# Patient Record
Sex: Female | Born: 1955 | Race: White | Hispanic: No | Marital: Married | Smoking: Never smoker
Health system: Southern US, Community
[De-identification: ages and names within clinical notes are randomized; demographics above are authoritative.]

---

## 2003-09-03 ENCOUNTER — Ambulatory Visit (HOSPITAL_COMMUNITY): Admission: RE | Admit: 2003-09-03 | Discharge: 2003-09-03 | Payer: Self-pay | Admitting: Family Medicine

## 2003-10-22 ENCOUNTER — Other Ambulatory Visit: Admission: RE | Admit: 2003-10-22 | Discharge: 2003-10-22 | Payer: Self-pay | Admitting: Family Medicine

## 2008-04-09 ENCOUNTER — Other Ambulatory Visit: Admission: RE | Admit: 2008-04-09 | Discharge: 2008-04-09 | Payer: Self-pay | Admitting: Family Medicine

## 2009-04-10 ENCOUNTER — Other Ambulatory Visit: Admission: RE | Admit: 2009-04-10 | Discharge: 2009-04-10 | Payer: Self-pay | Admitting: Family Medicine

## 2010-10-02 ENCOUNTER — Encounter: Payer: Self-pay | Admitting: Family Medicine

## 2012-05-30 ENCOUNTER — Other Ambulatory Visit (HOSPITAL_COMMUNITY)
Admission: RE | Admit: 2012-05-30 | Discharge: 2012-05-30 | Disposition: A | Discharge status: Home / Self Care | Payer: BC Managed Care – PPO | Source: Ambulatory Visit | Attending: Family Medicine | Admitting: Family Medicine

## 2012-05-30 ENCOUNTER — Other Ambulatory Visit: Payer: Self-pay | Admitting: Family Medicine

## 2012-05-30 DIAGNOSIS — Z1151 Encounter for screening for human papillomavirus (HPV): Secondary | ICD-10-CM | POA: Insufficient documentation

## 2012-05-30 DIAGNOSIS — Z124 Encounter for screening for malignant neoplasm of cervix: Secondary | ICD-10-CM | POA: Insufficient documentation

## 2014-11-09 ENCOUNTER — Emergency Department (HOSPITAL_COMMUNITY): Admission: EM | Admit: 2014-11-09 | Discharge: 2014-11-09 | Discharge status: Absent without leave | Payer: Self-pay

## 2014-11-13 ENCOUNTER — Other Ambulatory Visit: Payer: Self-pay | Admitting: Family Medicine

## 2014-11-13 ENCOUNTER — Ambulatory Visit
Admission: RE | Admit: 2014-11-13 | Discharge: 2014-11-13 | Disposition: A | Discharge status: Home / Self Care | Payer: Self-pay | Source: Ambulatory Visit | Attending: Family Medicine | Admitting: Family Medicine

## 2014-11-13 DIAGNOSIS — M5431 Sciatica, right side: Secondary | ICD-10-CM

## 2014-11-13 DIAGNOSIS — M5416 Radiculopathy, lumbar region: Secondary | ICD-10-CM

## 2014-11-22 ENCOUNTER — Ambulatory Visit (INDEPENDENT_AMBULATORY_CARE_PROVIDER_SITE_OTHER): Payer: BC Managed Care – PPO | Admitting: Physician Assistant

## 2014-11-22 VITALS — BP 142/84 | HR 70 | Temp 97.9°F | Resp 18 | Ht 60.5 in | Wt 117.4 lb

## 2014-11-22 DIAGNOSIS — M5431 Sciatica, right side: Secondary | ICD-10-CM

## 2014-11-22 DIAGNOSIS — M5136 Other intervertebral disc degeneration, lumbar region: Secondary | ICD-10-CM | POA: Diagnosis not present

## 2014-11-22 MED ORDER — CYCLOBENZAPRINE HCL 5 MG PO TABS: 5.0000 mg | ORAL_TABLET | Freq: Three times a day (TID) | ORAL | Status: AC | PRN

## 2014-11-22 MED ORDER — HYDROCODONE-ACETAMINOPHEN 5-325 MG PO TABS: 1.0000 | ORAL_TABLET | Freq: Four times a day (QID) | ORAL | Status: AC | PRN

## 2014-11-22 NOTE — Progress Notes (Signed)
Subjective:    Patient ID: Chloe Griffin, female    DOB: 1956/04/07, 59 y.o.   MRN: [id number]  Chief Complaint  Patient presents with  . Flank Pain    radiates to leg right side, has been x-rayed, told she had degenerative discs, going on for 18 days, been taking prednisone and meloxicam, not alleviating pain   Patient Active Problem List   Diagnosis Date Noted  . Degenerative disc disease, lumbar 11/22/2014   Medications, allergies, past medical history, surgical history, family history, social history and problem list reviewed and updated.  HPI  59 yof with no significant pmh presents with ongoing right sciatica.   Sx started suddenly 18 days ago when they awoke her from sleep. Had a sharp right sided low back pain along with right buttock pain. This pain persisted so she went to pcp couple days later. Lumbar xr showed mild ddd L4-5. Started on meloxicam.   Three days later was having no relief with meloxicam so went to diff urgent care. Given pred taper. She took 60 mg daily for 3 days but stopped the med at that point as was not having relief.   Today states pain is severe. Is keeping her up at night. She is able to walk fine and get around during the day but when lies down at night has radiating pain down right leg. Radiation has slowly been increasing in distance and past few days has gone down right buttock, right hamstring, right calf and into right heel. Denies any bowel/bladder dysfx or perianal los. Denies fever, chills.   Review of Systems See HPI.     Objective:   Physical Exam  Constitutional: She is oriented to person, place, and time. She appears well-developed and well-nourished.  Non-toxic appearance. She does not have a sickly appearance. She does not appear ill. No distress.  BP 142/84 mmHg  Pulse 70  Temp(Src) 97.9 F (36.6 C) (Oral)  Resp 18  Ht 5' 0.5" (1.537 m)  Wt 117 lb 6.4 oz (53.252 kg)  BMI 22.54 kg/m2  SpO2 98%   Musculoskeletal:   Lumbar back: She exhibits decreased range of motion. She exhibits no tenderness, no bony tenderness and no spasm.  Decreased rom due to pain. No ttp over lumbar spine or paraspinals.   Neurological: She is alert and oriented to person, place, and time.  Positive SLR right leg at 45 degrees. Negative with left leg. Normal sensation down right leg. Normal strength testing at right knee, ankle.   Psychiatric: She has a normal mood and affect. Her speech is normal.      Assessment & Plan:   59 yof with no significant pmh presents with ongoing right sciatica.   Degenerative disc disease, lumbar - Plan: Ambulatory referral to Orthopedic Surgery, Ambulatory referral to Physical Therapy, MR Lumbar Spine Wo Contrast Sciatica, right - Plan: Ambulatory referral to Orthopedic Surgery, Ambulatory referral to Physical Therapy, MR Lumbar Spine Wo Contrast, HYDROcodone-acetaminophen (NORCO) 5-325 MG per tablet, cyclobenzaprine (FLEXERIL) 5 MG tablet --sciatica pain most likely from bulged or herniated lumbar disc --pt has had lumbar xr which showed ddd L4-5 --has tried meloxicam and prednisone without relief --pain is keeping her up at night --referrals to orthopedics and physical therapy --order sent for lumbar mri with plan of having pt follow with ortho with results to quicken the process in this pt with obvious sciatica pain --norco qhs for pain, flexeril prn, continue meloxicam prn --low back stretches given reviewed with pt, perform if  no pain --er with bowel/bladder dysfx, perinanal los  Donnajean Lopesodd M. Kyler Lerette, PA-C Physician Assistant-Certified [facility]  11/22/2014 10:46 AM

## 2014-11-22 NOTE — Patient Instructions (Signed)
I have referred you to ortho, to physical therapy, and to have an MRI done.  They will all be in contact with you individually to schedule these things.  In the meantime you can try some of these stretches very slowly and lightly. If they hurt you do not do them. For the pain, I recommend continuing the meloxicam. Please take the norco at night to help you sleep. The muscle relaxant may help as well, you can take this up to three times per day. Be aware these meds may make you sleepy. Use caution with driving.  Please go to the ED right away if you start having trouble controlling your bowel or bladder or have loss of sensation in the area while wiping.   Sciatica with Rehab The sciatic nerve runs from the back down the leg and is responsible for sensation and control of the muscles in the back (posterior) side of the thigh, lower leg, and foot. Sciatica is a condition that is characterized by inflammation of this nerve.  SYMPTOMS   Signs of nerve damage, including numbness and/or weakness along the posterior side of the lower extremity.  Pain in the back of the thigh that may also travel down the leg.  Pain that worsens when sitting for long periods of time.  Occasionally, pain in the back or buttock. CAUSES  Inflammation of the sciatic nerve is the cause of sciatica. The inflammation is due to something irritating the nerve. Common sources of irritation include:  Sitting for long periods of time.  Direct trauma to the nerve.  Arthritis of the spine.  Herniated or ruptured disk.  Slipping of the vertebrae (spondylolisthesis).  Pressure from soft tissues, such as muscles or ligament-like tissue (fascia). RISK INCREASES WITH:  Sports that place pressure or stress on the spine (football or weightlifting).  Poor strength and flexibility.  Failure to warm up properly before activity.  Family history of low back pain or disk disorders.  Previous back injury or surgery.  Poor  body mechanics, especially when lifting, or poor posture. PREVENTION   Warm up and stretch properly before activity.  Maintain physical fitness:  Strength, flexibility, and endurance.  Cardiovascular fitness.  Learn and use proper technique, especially with posture and lifting. When possible, have coach correct improper technique.  Avoid activities that place stress on the spine. PROGNOSIS If treated properly, then sciatica usually resolves within 6 weeks. However, occasionally surgery is necessary.  RELATED COMPLICATIONS   Permanent nerve damage, including pain, numbness, tingle, or weakness.  Chronic back pain.  Risks of surgery: infection, bleeding, nerve damage, or damage to surrounding tissues. TREATMENT Treatment initially involves resting from any activities that aggravate your symptoms. The use of ice and medication may help reduce pain and inflammation. The use of strengthening and stretching exercises may help reduce pain with activity. These exercises may be performed at home or with referral to a therapist. A therapist may recommend further treatments, such as transcutaneous electronic nerve stimulation (TENS) or ultrasound. Your caregiver may recommend corticosteroid injections to help reduce inflammation of the sciatic nerve. If symptoms persist despite non-surgical (conservative) treatment, then surgery may be recommended. MEDICATION  If pain medication is necessary, then nonsteroidal anti-inflammatory medications, such as aspirin and ibuprofen, or other minor pain relievers, such as acetaminophen, are often recommended.  Do not take pain medication for 7 days before surgery.  Prescription pain relievers may be given if deemed necessary by your caregiver. Use only as directed and only as much as  you need.  Ointments applied to the skin may be helpful.  Corticosteroid injections may be given by your caregiver. These injections should be reserved for the most serious  cases, because they may only be given a certain number of times. HEAT AND COLD  Cold treatment (icing) relieves pain and reduces inflammation. Cold treatment should be applied for 10 to 15 minutes every 2 to 3 hours for inflammation and pain and immediately after any activity that aggravates your symptoms. Use ice packs or massage the area with a piece of ice (ice massage).  Heat treatment may be used prior to performing the stretching and strengthening activities prescribed by your caregiver, physical therapist, or athletic trainer. Use a heat pack or soak the injury in warm water. SEEK MEDICAL CARE IF:  Treatment seems to offer no benefit, or the condition worsens.  Any medications produce adverse side effects. EXERCISES  RANGE OF MOTION (ROM) AND STRETCHING EXERCISES - Sciatica Most people with sciatic will find that their symptoms worsen with either excessive bending forward (flexion) or arching at the low back (extension). The exercises which will help resolve your symptoms will focus on the opposite motion. Your physician, physical therapist or athletic trainer will help you determine which exercises will be most helpful to resolve your low back pain. Do not complete any exercises without first consulting with your clinician. Discontinue any exercises which worsen your symptoms until you speak to your clinician. If you have pain, numbness or tingling which travels down into your buttocks, leg or foot, the goal of the therapy is for these symptoms to move closer to your back and eventually resolve. Occasionally, these leg symptoms will get better, but your low back pain may worsen; this is typically an indication of progress in your rehabilitation. Be certain to be very alert to any changes in your symptoms and the activities in which you participated in the 24 hours prior to the change. Sharing this information with your clinician will allow him/her to most efficiently treat your condition. These  exercises may help you when beginning to rehabilitate your injury. Your symptoms may resolve with or without further involvement from your physician, physical therapist or athletic trainer. While completing these exercises, remember:   Restoring tissue flexibility helps normal motion to return to the joints. This allows healthier, less painful movement and activity.  An effective stretch should be held for at least 30 seconds.  A stretch should never be painful. You should only feel a gentle lengthening or release in the stretched tissue. FLEXION RANGE OF MOTION AND STRETCHING EXERCISES: STRETCH - Flexion, Single Knee to Chest   Lie on a firm bed or floor with both legs extended in front of you.  Keeping one leg in contact with the floor, bring your opposite knee to your chest. Hold your leg in place by either grabbing behind your thigh or at your knee.  Pull until you feel a gentle stretch in your low back. Hold __________ seconds.  Slowly release your grasp and repeat the exercise with the opposite side. Repeat __________ times. Complete this exercise __________ times per day.  STRETCH - Flexion, Double Knee to Chest  Lie on a firm bed or floor with both legs extended in front of you.  Keeping one leg in contact with the floor, bring your opposite knee to your chest.  Tense your stomach muscles to support your back and then lift your other knee to your chest. Hold your legs in place by either grabbing  behind your thighs or at your knees.  Pull both knees toward your chest until you feel a gentle stretch in your low back. Hold __________ seconds.  Tense your stomach muscles and slowly return one leg at a time to the floor. Repeat __________ times. Complete this exercise __________ times per day.  STRETCH - Low Trunk Rotation   Lie on a firm bed or floor. Keeping your legs in front of you, bend your knees so they are both pointed toward the ceiling and your feet are flat on the  floor.  Extend your arms out to the side. This will stabilize your upper body by keeping your shoulders in contact with the floor.  Gently and slowly drop both knees together to one side until you feel a gentle stretch in your low back. Hold for __________ seconds.  Tense your stomach muscles to support your low back as you bring your knees back to the starting position. Repeat the exercise to the other side. Repeat __________ times. Complete this exercise __________ times per day  EXTENSION RANGE OF MOTION AND FLEXIBILITY EXERCISES: STRETCH - Extension, Prone on Elbows  Lie on your stomach on the floor, a bed will be too soft. Place your palms about shoulder width apart and at the height of your head.  Place your elbows under your shoulders. If this is too painful, stack pillows under your chest.  Allow your body to relax so that your hips drop lower and make contact more completely with the floor.  Hold this position for __________ seconds.  Slowly return to lying flat on the floor. Repeat __________ times. Complete this exercise __________ times per day.  RANGE OF MOTION - Extension, Prone Press Ups  Lie on your stomach on the floor, a bed will be too soft. Place your palms about shoulder width apart and at the height of your head.  Keeping your back as relaxed as possible, slowly straighten your elbows while keeping your hips on the floor. You may adjust the placement of your hands to maximize your comfort. As you gain motion, your hands will come more underneath your shoulders.  Hold this position __________ seconds.  Slowly return to lying flat on the floor. Repeat __________ times. Complete this exercise __________ times per day.  STRENGTHENING EXERCISES - Sciatica  These exercises may help you when beginning to rehabilitate your injury. These exercises should be done near your "sweet spot." This is the neutral, low-back arch, somewhere between fully rounded and fully arched,  that is your least painful position. When performed in this safe range of motion, these exercises can be used for people who have either a flexion or extension based injury. These exercises may resolve your symptoms with or without further involvement from your physician, physical therapist or athletic trainer. While completing these exercises, remember:   Muscles can gain both the endurance and the strength needed for everyday activities through controlled exercises.  Complete these exercises as instructed by your physician, physical therapist or athletic trainer. Progress with the resistance and repetition exercises only as your caregiver advises.  You may experience muscle soreness or fatigue, but the pain or discomfort you are trying to eliminate should never worsen during these exercises. If this pain does worsen, stop and make certain you are following the directions exactly. If the pain is still present after adjustments, discontinue the exercise until you can discuss the trouble with your clinician. STRENGTHENING - Deep Abdominals, Pelvic Tilt   Lie on a firm bed  or floor. Keeping your legs in front of you, bend your knees so they are both pointed toward the ceiling and your feet are flat on the floor.  Tense your lower abdominal muscles to press your low back into the floor. This motion will rotate your pelvis so that your tail bone is scooping upwards rather than pointing at your feet or into the floor.  With a gentle tension and even breathing, hold this position for __________ seconds. Repeat __________ times. Complete this exercise __________ times per day.  STRENGTHENING - Abdominals, Crunches   Lie on a firm bed or floor. Keeping your legs in front of you, bend your knees so they are both pointed toward the ceiling and your feet are flat on the floor. Cross your arms over your chest.  Slightly tip your chin down without bending your neck.  Tense your abdominals and slowly lift  your trunk high enough to just clear your shoulder blades. Lifting higher can put excessive stress on the low back and does not further strengthen your abdominal muscles.  Control your return to the starting position. Repeat __________ times. Complete this exercise __________ times per day.  STRENGTHENING - Quadruped, Opposite UE/LE Lift  Assume a hands and knees position on a firm surface. Keep your hands under your shoulders and your knees under your hips. You may place padding under your knees for comfort.  Find your neutral spine and gently tense your abdominal muscles so that you can maintain this position. Your shoulders and hips should form a rectangle that is parallel with the floor and is not twisted.  Keeping your trunk steady, lift your right hand no higher than your shoulder and then your left leg no higher than your hip. Make sure you are not holding your breath. Hold this position __________ seconds.  Continuing to keep your abdominal muscles tense and your back steady, slowly return to your starting position. Repeat with the opposite arm and leg. Repeat __________ times. Complete this exercise __________ times per day.  STRENGTHENING - Abdominals and Quadriceps, Straight Leg Raise   Lie on a firm bed or floor with both legs extended in front of you.  Keeping one leg in contact with the floor, bend the other knee so that your foot can rest flat on the floor.  Find your neutral spine, and tense your abdominal muscles to maintain your spinal position throughout the exercise.  Slowly lift your straight leg off the floor about 6 inches for a count of 15, making sure to not hold your breath.  Still keeping your neutral spine, slowly lower your leg all the way to the floor. Repeat this exercise with each leg __________ times. Complete this exercise __________ times per day. POSTURE AND BODY MECHANICS CONSIDERATIONS - Sciatica Keeping correct posture when sitting, standing or  completing your activities will reduce the stress put on different body tissues, allowing injured tissues a chance to heal and limiting painful experiences. The following are general guidelines for improved posture. Your physician or physical therapist will provide you with any instructions specific to your needs. While reading these guidelines, remember:  The exercises prescribed by your provider will help you have the flexibility and strength to maintain correct postures.  The correct posture provides the optimal environment for your joints to work. All of your joints have less wear and tear when properly supported by a spine with good posture. This means you will experience a healthier, less painful body.  Correct posture must be  practiced with all of your activities, especially prolonged sitting and standing. Correct posture is as important when doing repetitive low-stress activities (typing) as it is when doing a single heavy-load activity (lifting). RESTING POSITIONS Consider which positions are most painful for you when choosing a resting position. If you have pain with flexion-based activities (sitting, bending, stooping, squatting), choose a position that allows you to rest in a less flexed posture. You would want to avoid curling into a fetal position on your side. If your pain worsens with extension-based activities (prolonged standing, working overhead), avoid resting in an extended position such as sleeping on your stomach. Most people will find more comfort when they rest with their spine in a more neutral position, neither too rounded nor too arched. Lying on a non-sagging bed on your side with a pillow between your knees, or on your back with a pillow under your knees will often provide some relief. Keep in mind, being in any one position for a prolonged period of time, no matter how correct your posture, can still lead to stiffness. PROPER SITTING POSTURE In order to minimize stress and  discomfort on your spine, you must sit with correct posture Sitting with good posture should be effortless for a healthy body. Returning to good posture is a gradual process. Many people can work toward this most comfortably by using various supports until they have the flexibility and strength to maintain this posture on their own. When sitting with proper posture, your ears will fall over your shoulders and your shoulders will fall over your hips. You should use the back of the chair to support your upper back. Your low back will be in a neutral position, just slightly arched. You may place a small pillow or folded towel at the base of your low back for support.  When working at a desk, create an environment that supports good, upright posture. Without extra support, muscles fatigue and lead to excessive strain on joints and other tissues. Keep these recommendations in mind: CHAIR:   A chair should be able to slide under your desk when your back makes contact with the back of the chair. This allows you to work closely.  The chair's height should allow your eyes to be level with the upper part of your monitor and your hands to be slightly lower than your elbows. BODY POSITION  Your feet should make contact with the floor. If this is not possible, use a foot rest.  Keep your ears over your shoulders. This will reduce stress on your neck and low back. INCORRECT SITTING POSTURES   If you are feeling tired and unable to assume a healthy sitting posture, do not slouch or slump. This puts excessive strain on your back tissues, causing more damage and pain. Healthier options include:  Using more support, like a lumbar pillow.  Switching tasks to something that requires you to be upright or walking.  Talking a brief walk.  Lying down to rest in a neutral-spine position. PROLONGED STANDING WHILE SLIGHTLY LEANING FORWARD  When completing a task that requires you to lean forward while standing in one  place for a long time, place either foot up on a stationary 2-4 inch high object to help maintain the best posture. When both feet are on the ground, the low back tends to lose its slight inward curve. If this curve flattens (or becomes too large), then the back and your other joints will experience too much stress, fatigue more quickly and  can cause pain.  CORRECT STANDING POSTURES Proper standing posture should be assumed with all daily activities, even if they only take a few moments, like when brushing your teeth. As in sitting, your ears should fall over your shoulders and your shoulders should fall over your hips. You should keep a slight tension in your abdominal muscles to brace your spine. Your tailbone should point down to the ground, not behind your body, resulting in an over-extended swayback posture.  INCORRECT STANDING POSTURES  Common incorrect standing postures include a forward head, locked knees and/or an excessive swayback. WALKING Walk with an upright posture. Your ears, shoulders and hips should all line-up. PROLONGED ACTIVITY IN A FLEXED POSITION When completing a task that requires you to bend forward at your waist or lean over a low surface, try to find a way to stabilize 3 of 4 of your limbs. You can place a hand or elbow on your thigh or rest a knee on the surface you are reaching across. This will provide you more stability so that your muscles do not fatigue as quickly. By keeping your knees relaxed, or slightly bent, you will also reduce stress across your low back. CORRECT LIFTING TECHNIQUES DO :   Assume a wide stance. This will provide you more stability and the opportunity to get as close as possible to the object which you are lifting.  Tense your abdominals to brace your spine; then bend at the knees and hips. Keeping your back locked in a neutral-spine position, lift using your leg muscles. Lift with your legs, keeping your back straight.  Test the weight of  unknown objects before attempting to lift them.  Try to keep your elbows locked down at your sides in order get the best strength from your shoulders when carrying an object.  Always ask for help when lifting heavy or awkward objects. INCORRECT LIFTING TECHNIQUES DO NOT:   Lock your knees when lifting, even if it is a small object.  Bend and twist. Pivot at your feet or move your feet when needing to change directions.  Assume that you cannot safely pick up a paperclip without proper posture. Document Released: 08/29/2005 Document Revised: 01/13/2014 Document Reviewed: 12/11/2008 [facility] Patient Information 2015 [place]. This information is not intended to replace advice given to you by your health care provider. Make sure you discuss any questions you have with your health care provider.

## 2014-12-01 ENCOUNTER — Telehealth: Payer: Self-pay

## 2014-12-01 NOTE — Telephone Encounter (Signed)
Patient came in to drop [facility]. Patient states the form wasn't filled out correctly by the provider. A copy has been made and placed in [employer] box. Patient phone: [phone]

## 2014-12-01 NOTE — Telephone Encounter (Signed)
fyi todd

## 2014-12-03 NOTE — Telephone Encounter (Signed)
Called and left msg. Informed pt that since I also referred her to ortho for her low back at our appt that I will wait for them to see her in order to order the lumbar MRI if they feel she needs one. Instructed pt to call back with any questions.

## 2015-05-18 ENCOUNTER — Ambulatory Visit (INDEPENDENT_AMBULATORY_CARE_PROVIDER_SITE_OTHER): Payer: BC Managed Care – PPO | Admitting: Physician Assistant

## 2015-05-18 VITALS — BP 158/80 | HR 68 | Temp 97.5°F | Resp 16 | Ht 60.0 in | Wt 117.0 lb

## 2015-05-18 DIAGNOSIS — T304 Corrosion of unspecified body region, unspecified degree: Secondary | ICD-10-CM

## 2015-05-18 DIAGNOSIS — L0103 Bullous impetigo: Secondary | ICD-10-CM | POA: Diagnosis not present

## 2015-05-18 MED ORDER — MUPIROCIN 2 % EX OINT: 1.0000 "application " | TOPICAL_OINTMENT | Freq: Three times a day (TID) | CUTANEOUS | Status: AC

## 2015-05-18 MED ORDER — AMOXICILLIN-POT CLAVULANATE 875-125 MG PO TABS: 1.0000 | ORAL_TABLET | Freq: Two times a day (BID) | ORAL | Status: AC
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
## 2016-05-11 ENCOUNTER — Other Ambulatory Visit (HOSPITAL_COMMUNITY)
Admission: RE | Admit: 2016-05-11 | Discharge: 2016-05-11 | Disposition: A | Discharge status: Home / Self Care | Payer: BC Managed Care – PPO | Source: Ambulatory Visit | Attending: Family Medicine | Admitting: Family Medicine

## 2016-05-11 ENCOUNTER — Other Ambulatory Visit: Payer: Self-pay

## 2016-05-11 ENCOUNTER — Other Ambulatory Visit: Payer: Self-pay | Admitting: Family Medicine

## 2016-05-11 DIAGNOSIS — E2839 Other primary ovarian failure: Secondary | ICD-10-CM

## 2016-05-11 DIAGNOSIS — Z124 Encounter for screening for malignant neoplasm of cervix: Secondary | ICD-10-CM | POA: Diagnosis not present

## 2016-05-11 DIAGNOSIS — Z1231 Encounter for screening mammogram for malignant neoplasm of breast: Secondary | ICD-10-CM

## 2016-05-13 LAB — CYTOLOGY - PAP

## 2016-06-07 ENCOUNTER — Ambulatory Visit
Admission: RE | Admit: 2016-06-07 | Discharge: 2016-06-07 | Disposition: A | Discharge status: Home / Self Care | Payer: BC Managed Care – PPO | Source: Ambulatory Visit | Attending: Family Medicine | Admitting: Family Medicine

## 2016-06-07 ENCOUNTER — Other Ambulatory Visit: Payer: Self-pay | Admitting: Family Medicine

## 2016-06-07 DIAGNOSIS — Z1231 Encounter for screening mammogram for malignant neoplasm of breast: Secondary | ICD-10-CM

## 2016-06-07 DIAGNOSIS — E2839 Other primary ovarian failure: Secondary | ICD-10-CM

## 2019-04-17 ENCOUNTER — Other Ambulatory Visit: Payer: Self-pay | Admitting: Family Medicine

## 2019-04-17 DIAGNOSIS — Z1231 Encounter for screening mammogram for malignant neoplasm of breast: Secondary | ICD-10-CM

## 2019-05-31 ENCOUNTER — Other Ambulatory Visit: Payer: Self-pay

## 2019-05-31 ENCOUNTER — Ambulatory Visit
Admission: RE | Admit: 2019-05-31 | Discharge: 2019-05-31 | Disposition: A | Discharge status: Home / Self Care | Payer: BC Managed Care – PPO | Source: Ambulatory Visit | Attending: Family Medicine | Admitting: Family Medicine

## 2019-05-31 DIAGNOSIS — Z1231 Encounter for screening mammogram for malignant neoplasm of breast: Secondary | ICD-10-CM

## 2019-08-29 ENCOUNTER — Other Ambulatory Visit (HOSPITAL_COMMUNITY)
Admission: RE | Admit: 2019-08-29 | Discharge: 2019-08-29 | Disposition: A | Discharge status: Home / Self Care | Payer: BC Managed Care – PPO | Source: Ambulatory Visit | Attending: Physician Assistant | Admitting: Physician Assistant

## 2019-08-29 ENCOUNTER — Other Ambulatory Visit: Payer: Self-pay | Admitting: Physician Assistant

## 2019-08-29 DIAGNOSIS — Z124 Encounter for screening for malignant neoplasm of cervix: Secondary | ICD-10-CM | POA: Diagnosis not present

## 2019-09-02 LAB — CYTOLOGY - PAP
Comment: NEGATIVE
Diagnosis: NEGATIVE
High risk HPV: NEGATIVE

## 2020-05-13 IMAGING — MG MM DIGITAL SCREENING BILAT W/ CAD
6 series · 6 of 6 positions shown · non-contrast
Comparison: None.

ACR Breast Density Category a: The breast tissue is almost entirely
fatty.

CLINICAL DATA: Screening.

EXAM:
DIGITAL SCREENING BILATERAL MAMMOGRAM WITH CAD

[R MLO]
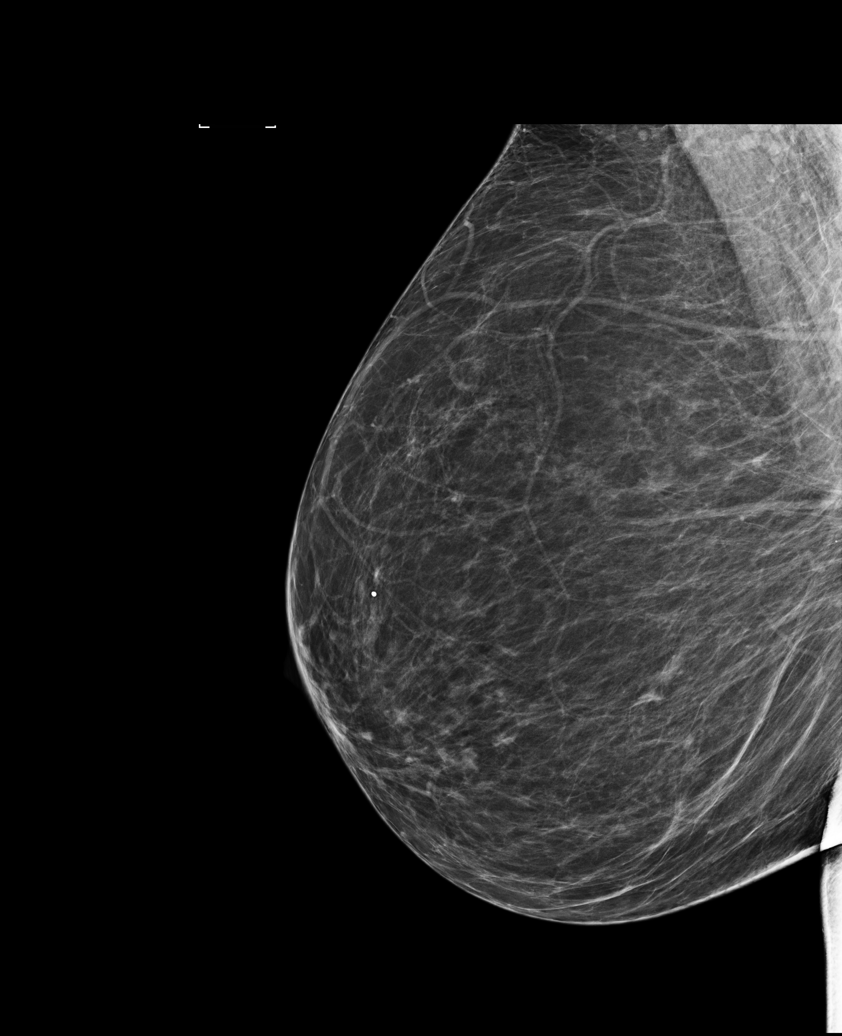

[L MLO (1 of 2)]
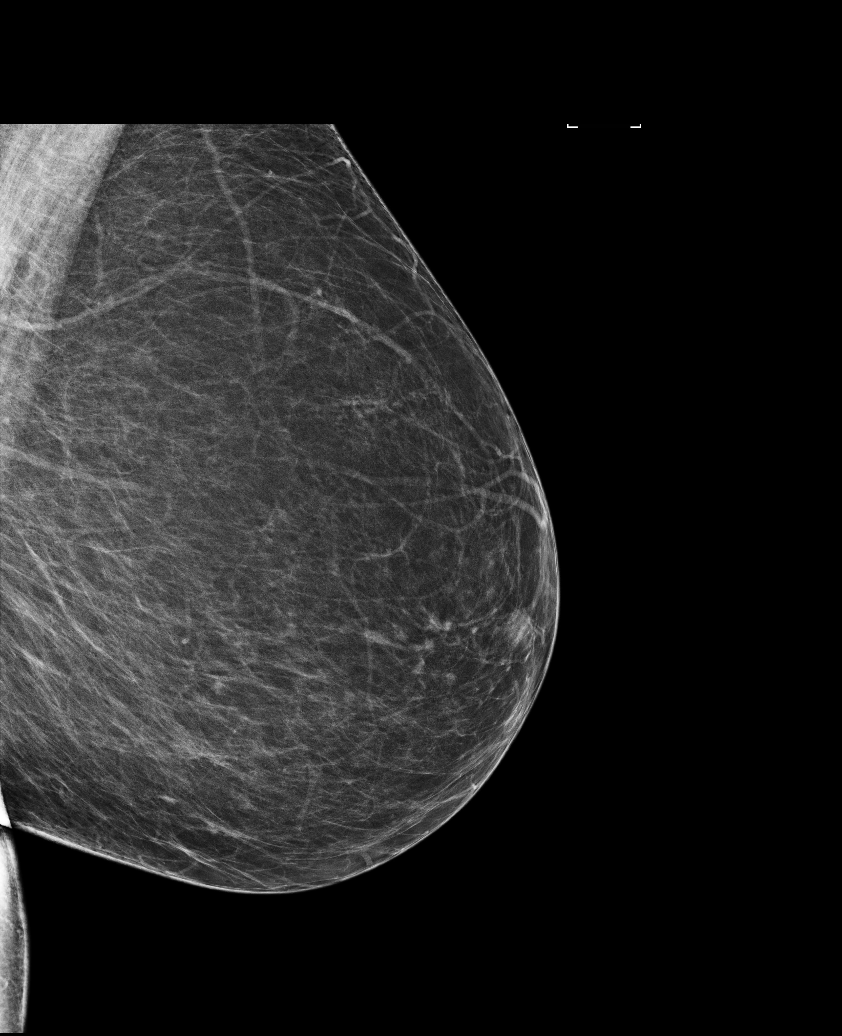

[R CC (1 of 2)]
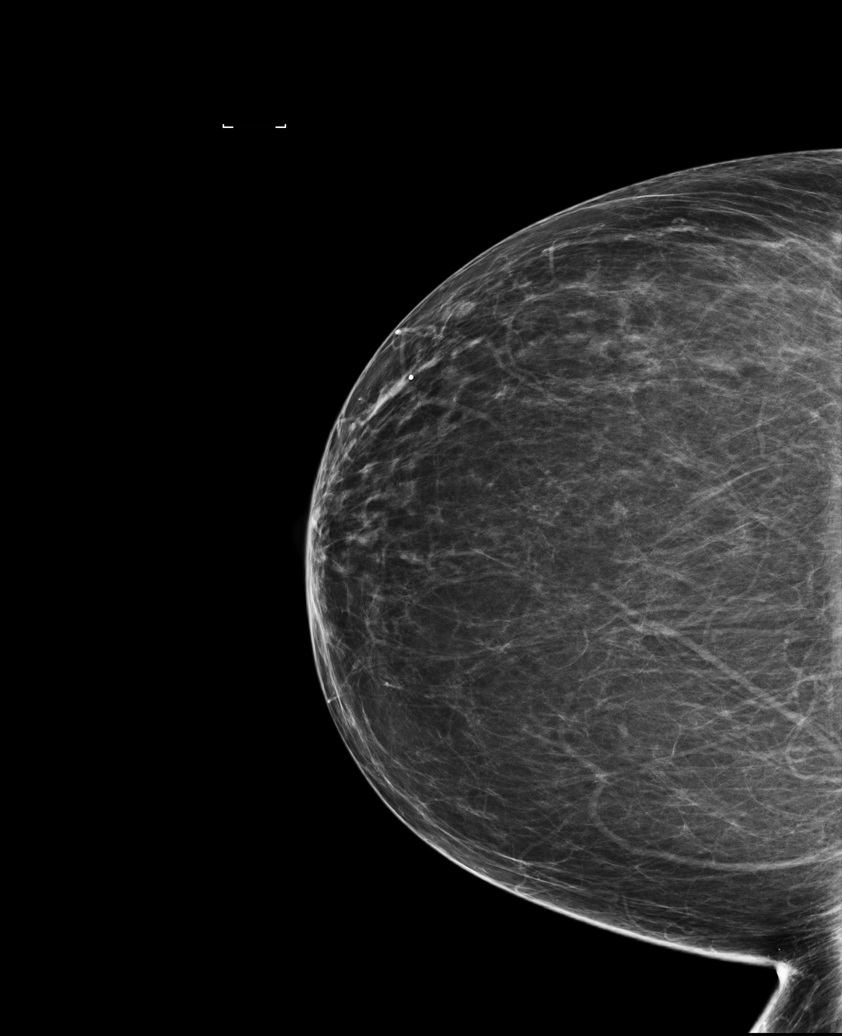

[L CC]
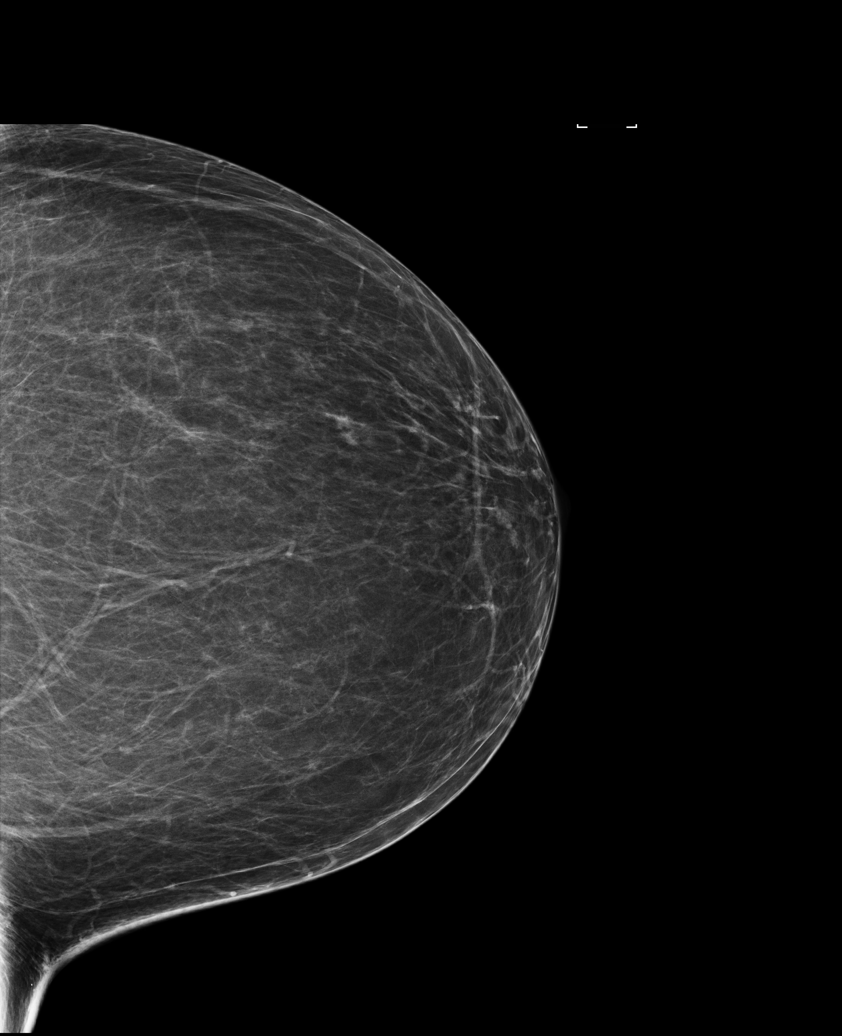

[R CC (2 of 2)]
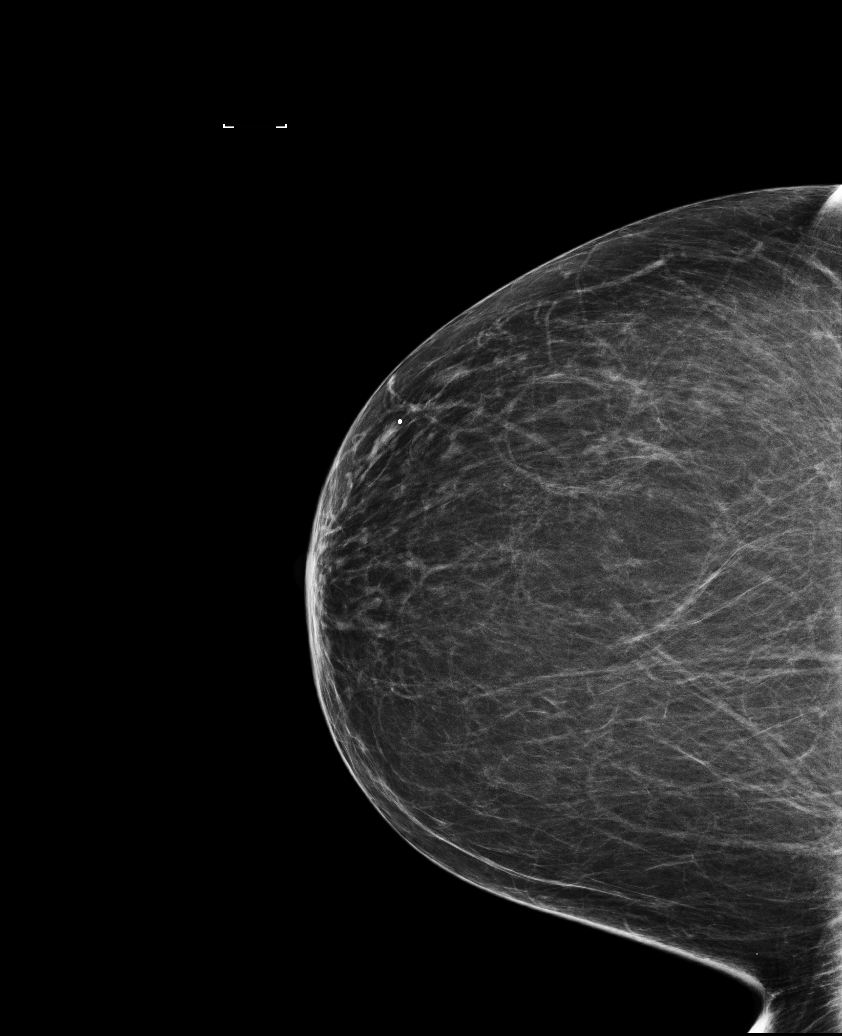

[L MLO (2 of 2)]
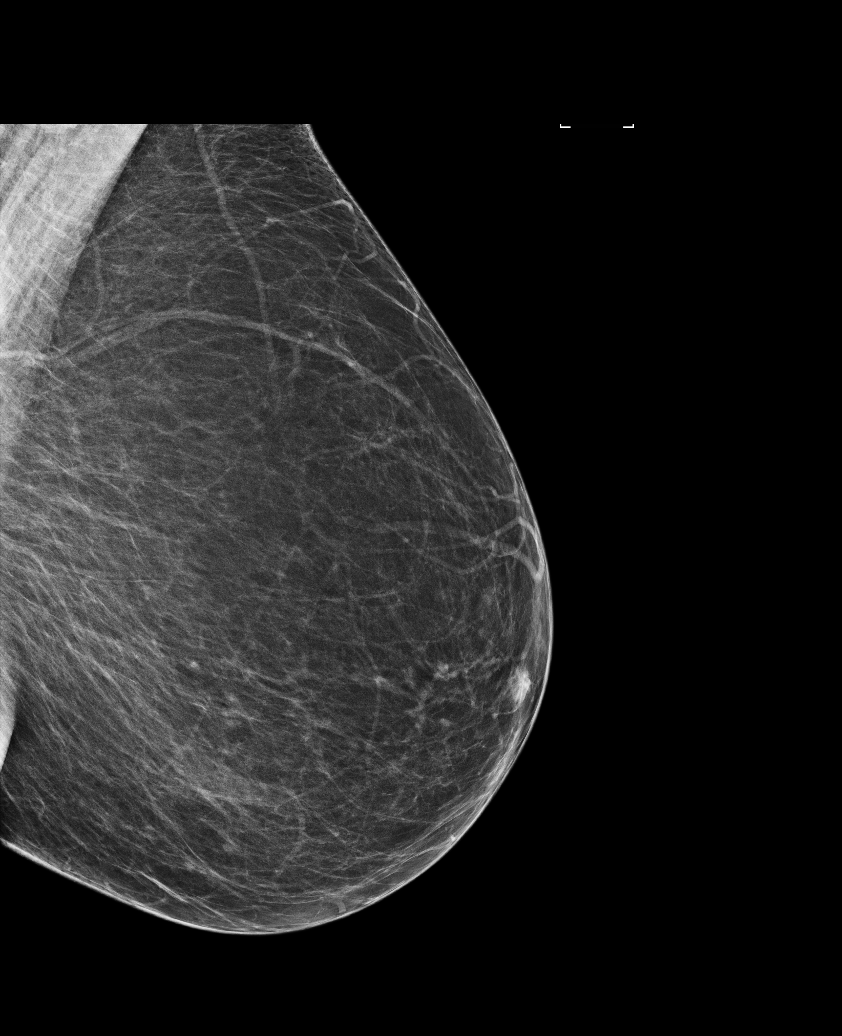

[6 of 6 positions shown; findings below may reference images not displayed]

FINDINGS: There are no findings suspicious for malignancy. Images were
processed with CAD.
IMPRESSION: No mammographic evidence of malignancy. A result letter of this
screening mammogram will be mailed directly to the patient.

RECOMMENDATION:
Screening mammogram in one year. (Code:JT-G-VWB)

BI-RADS CATEGORY  1: Negative.

## 2022-08-02 ENCOUNTER — Other Ambulatory Visit: Payer: Self-pay | Admitting: Family Medicine

## 2022-08-02 DIAGNOSIS — Z1231 Encounter for screening mammogram for malignant neoplasm of breast: Secondary | ICD-10-CM

## 2022-09-28 ENCOUNTER — Ambulatory Visit: Payer: BC Managed Care – PPO

## 2022-12-09 ENCOUNTER — Ambulatory Visit
Admission: RE | Admit: 2022-12-09 | Discharge: 2022-12-09 | Disposition: A | Discharge status: Home / Self Care | Payer: BC Managed Care – PPO | Source: Ambulatory Visit | Attending: Family Medicine | Admitting: Family Medicine

## 2022-12-09 DIAGNOSIS — Z1231 Encounter for screening mammogram for malignant neoplasm of breast: Secondary | ICD-10-CM

## 2023-12-13 ENCOUNTER — Other Ambulatory Visit (HOSPITAL_COMMUNITY): Payer: Self-pay | Admitting: Family Medicine

## 2023-12-13 DIAGNOSIS — E7800 Pure hypercholesterolemia, unspecified: Secondary | ICD-10-CM

## 2024-02-07 ENCOUNTER — Other Ambulatory Visit: Payer: Self-pay | Admitting: Family Medicine

## 2024-02-07 DIAGNOSIS — Z1231 Encounter for screening mammogram for malignant neoplasm of breast: Secondary | ICD-10-CM

## 2024-02-19 ENCOUNTER — Ambulatory Visit
Admission: RE | Admit: 2024-02-19 | Discharge: 2024-02-19 | Disposition: A | Discharge status: Home / Self Care | Payer: Self-pay | Source: Ambulatory Visit | Attending: Family Medicine | Admitting: Family Medicine

## 2024-02-19 DIAGNOSIS — Z1231 Encounter for screening mammogram for malignant neoplasm of breast: Secondary | ICD-10-CM
